# Patient Record
Sex: Female | Born: 1985 | Race: White | Hispanic: No | Marital: Married | Smoking: Current every day smoker
Health system: Southern US, Community
[De-identification: ages and names within clinical notes are randomized; demographics above are authoritative.]

## PROBLEM LIST (undated history)

## (undated) HISTORY — PX: WISDOM TOOTH EXTRACTION: SHX21

---
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
## 2019-06-16 ENCOUNTER — Other Ambulatory Visit: Payer: Self-pay

## 2019-06-16 ENCOUNTER — Emergency Department (HOSPITAL_COMMUNITY)
Admission: EM | Admit: 2019-06-16 | Discharge: 2019-06-16 | Disposition: A | Discharge status: Home / Self Care | Payer: 59 | Attending: Emergency Medicine | Admitting: Emergency Medicine

## 2019-06-16 ENCOUNTER — Emergency Department (HOSPITAL_COMMUNITY): Payer: 59

## 2019-06-16 ENCOUNTER — Encounter (HOSPITAL_COMMUNITY): Payer: Self-pay | Admitting: Emergency Medicine

## 2019-06-16 DIAGNOSIS — R1031 Right lower quadrant pain: Secondary | ICD-10-CM | POA: Diagnosis not present

## 2019-06-16 DIAGNOSIS — R300 Dysuria: Secondary | ICD-10-CM | POA: Insufficient documentation

## 2019-06-16 DIAGNOSIS — R110 Nausea: Secondary | ICD-10-CM | POA: Diagnosis not present

## 2019-06-16 DIAGNOSIS — R935 Abnormal findings on diagnostic imaging of other abdominal regions, including retroperitoneum: Secondary | ICD-10-CM | POA: Insufficient documentation

## 2019-06-16 DIAGNOSIS — I700 Atherosclerosis of aorta: Secondary | ICD-10-CM | POA: Insufficient documentation

## 2019-06-16 DIAGNOSIS — F17210 Nicotine dependence, cigarettes, uncomplicated: Secondary | ICD-10-CM | POA: Diagnosis not present

## 2019-06-16 LAB — CBC WITH DIFFERENTIAL/PLATELET
Abs Immature Granulocytes: 0.03 10*3/uL (ref 0.00–0.07)
Basophils Absolute: 0 10*3/uL (ref 0.0–0.1)
Basophils Relative: 0 %
Eosinophils Absolute: 0.1 10*3/uL (ref 0.0–0.5)
Eosinophils Relative: 1 %
HCT: 42.6 % (ref 36.0–46.0)
Hemoglobin: 14 g/dL (ref 12.0–15.0)
Immature Granulocytes: 0 %
Lymphocytes Relative: 26 %
Lymphs Abs: 2.3 10*3/uL (ref 0.7–4.0)
MCH: 30.2 pg (ref 26.0–34.0)
MCHC: 32.9 g/dL (ref 30.0–36.0)
MCV: 92 fL (ref 80.0–100.0)
Monocytes Absolute: 0.5 10*3/uL (ref 0.1–1.0)
Monocytes Relative: 5 %
Neutro Abs: 5.8 10*3/uL (ref 1.7–7.7)
Neutrophils Relative %: 68 %
Platelets: 216 10*3/uL (ref 150–400)
RBC: 4.63 MIL/uL (ref 3.87–5.11)
RDW: 12.8 % (ref 11.5–15.5)
WBC: 8.6 10*3/uL (ref 4.0–10.5)
nRBC: 0 % (ref 0.0–0.2)

## 2019-06-16 LAB — COMPREHENSIVE METABOLIC PANEL
ALT: 23 U/L (ref 0–44)
AST: 16 U/L (ref 15–41)
Albumin: 4.3 g/dL (ref 3.5–5.0)
Alkaline Phosphatase: 58 U/L (ref 38–126)
Anion gap: 8 (ref 5–15)
BUN: 12 mg/dL (ref 6–20)
CO2: 23 mmol/L (ref 22–32)
Calcium: 8.8 mg/dL — ABNORMAL LOW (ref 8.9–10.3)
Chloride: 104 mmol/L (ref 98–111)
Creatinine, Ser: 0.6 mg/dL (ref 0.44–1.00)
GFR calc Af Amer: >60 mL/min (ref >60)
GFR calc non Af Amer: >60 mL/min (ref >60)
Glucose, Bld: 87 mg/dL (ref 70–99)
Potassium: 3.8 mmol/L (ref 3.5–5.1)
Sodium: 135 mmol/L (ref 135–145)
Total Bilirubin: 0.5 mg/dL (ref 0.3–1.2)
Total Protein: 6.7 g/dL (ref 6.5–8.1)

## 2019-06-16 LAB — LIPASE, BLOOD: Lipase: 27 U/L (ref 11–51)

## 2019-06-16 LAB — URINALYSIS, ROUTINE W REFLEX MICROSCOPIC
Bilirubin Urine: NEGATIVE
Glucose, UA: NEGATIVE mg/dL
Hgb urine dipstick: NEGATIVE
Ketones, ur: NEGATIVE mg/dL
Leukocytes,Ua: NEGATIVE
Nitrite: NEGATIVE
Protein, ur: NEGATIVE mg/dL
Specific Gravity, Urine: 1.016 (ref 1.005–1.030)
pH: 6 (ref 5.0–8.0)

## 2019-06-16 MED ORDER — IOHEXOL 300 MG/ML  SOLN
100.0000 mL | Freq: Once | INTRAMUSCULAR | Status: AC | PRN
  Administered 2019-06-16: 21:00:00 100 mL via INTRAVENOUS

## 2019-06-16 NOTE — Discharge Instructions (Addendum)
You were seen in the ED today for abdominal pain on the right side.  Your lab work was reassuring today.  We did obtain a CT scan to check for appendicitis which was negative.  It did however show some thickening in your small bowel suggestive of inflammatory bowel disease.  Please follow-up with Dr. Laural Golden tomorrow regarding your symptoms.  I would advise taking Tylenol as needed for your pain at the moment.  Is recommended that you increase the fiber in your diet to help with bowel movements.  It is important to continue having bowel movements because if you become constipated you may encounter an infection.  Please see attached resource on high-fiber diet.  Return to the ED immediately for any worsening pain, vomiting, fevers greater than 100.4, blood in your stool, inability to have a bowel movement.

## 2019-06-16 NOTE — ED Triage Notes (Signed)
Pt states that she has been having left sided abd pain since Saturday night. She states that she has bee bloated, diarrhea, pain with urination, and nausea with this pain.

## 2019-06-16 NOTE — ED Provider Notes (Signed)
[facility] Provider Note   CSN: [id number] Arrival date & time: 06/16/19  1758    History   Chief Complaint Chief Complaint  Patient presents with   Abdominal Pain    HPI Barbara Garrett is a 33 y.o. female who presents to the ED today complaining of gradual onset, constant, achy, right lower quadrant abdominal pain for 2 days.  She also complains about nausea and dysuria.  No history of kidney stones, UTI, kidney infection in the past.  Patient has not been taking anything for her pain.  She reports that the pain became so bad today that she came to the ED for further evaluation.  No recent suspicious food intake.  Patient is occasional alcohol drinker.  No recent antibiotics.  Denies fever, chills, vomiting, flank pain, vaginal discharge, vaginal bleeding, back pain.  Patient still has her appendix.  Last normal menstrual period about 2 weeks ago.  Past surgical history includes tubal ligation.      No past medical history on file.  There are no active problems to display for this patient.      OB History   No obstetric history on file.      Home Medications    Prior to Admission medications   Not on File    Family History No family history on file.  Social History Social History   Tobacco Use   Smoking status: Current Every Day Smoker    Packs/day: 1.00    Types: Cigarettes   Smokeless tobacco: Never Used  Substance Use Topics   Alcohol use: Yes    Comment: occ.    Drug use: Never     Allergies   Hydrocodone and Adhesive [tape]   Review of Systems Review of Systems  Constitutional: Negative for chills and fever.  HENT: Negative for congestion.   Eyes: Negative for visual disturbance.  Respiratory: Negative for shortness of breath.   Cardiovascular: Negative for chest pain.  Gastrointestinal: Positive for abdominal pain and nausea. Negative for constipation, diarrhea and vomiting.  Genitourinary: Positive for dysuria.  Negative for flank pain, frequency, pelvic pain, vaginal bleeding and vaginal discharge.  Musculoskeletal: Negative for myalgias.  Skin: Negative for rash.  Neurological: Negative for headaches.     Physical Exam Updated Vital Signs BP 130/88 (BP Location: Right Arm)    Pulse 78    Temp 98.1 F (36.7 C) (Oral)    Resp 14    Ht 5\' 4"  (1.626 m)    Wt 76.2 kg    LMP 05/26/2019    SpO2 100%    BMI 28.84 kg/m   Physical Exam Vitals signs and nursing note reviewed.  Constitutional:      Appearance: She is not ill-appearing.  HENT:     Head: Normocephalic and atraumatic.  Eyes:     Conjunctiva/sclera: Conjunctivae normal.  Neck:     Musculoskeletal: Neck supple.  Cardiovascular:     Rate and Rhythm: Normal rate and regular rhythm.     Heart sounds: Normal heart sounds.  Pulmonary:     Effort: Pulmonary effort is normal.     Breath sounds: Normal breath sounds. No wheezing, rhonchi or rales.  Abdominal:     General: Abdomen is flat.     Palpations: Abdomen is soft.     Tenderness: There is abdominal tenderness in the right lower quadrant. There is no right CVA tenderness or left CVA tenderness. Positive signs include Rovsing's sign and McBurney's sign. Negative signs include psoas sign  and obturator sign.  Skin:    General: Skin is warm and dry.  Neurological:     Mental Status: She is alert.      ED Treatments / Results  Labs (all labs ordered are listed, but only abnormal results are displayed) Labs Reviewed  COMPREHENSIVE METABOLIC PANEL - Abnormal; Notable for the following components:      Result Value   Calcium 8.8 (*)    All other components within normal limits  URINALYSIS, ROUTINE W REFLEX MICROSCOPIC - Abnormal; Notable for the following components:   APPearance HAZY (*)    All other components within normal limits  LIPASE, BLOOD  CBC WITH DIFFERENTIAL/PLATELET    EKG None  Radiology Ct Abdomen Pelvis W Contrast  Result Date: 06/16/2019 CLINICAL DATA:   Abdominal pain, appendicitis suspected. Pain worse at right lower quadrant EXAM: CT ABDOMEN AND PELVIS WITH CONTRAST TECHNIQUE: Multidetector CT imaging of the abdomen and pelvis was performed using the standard protocol following bolus administration of intravenous contrast. CONTRAST:  100mL OMNIPAQUE IOHEXOL 300 MG/ML  SOLN COMPARISON:  None. FINDINGS: Lower chest: Posterior and dependent ground-glass opacity likely reflecting basilar atelectasis. Included portions of the mediastinum are unremarkable. Hepatobiliary: No focal liver abnormality is seen. Layering hyperattenuating biliary sand versus calcified gallstones. No gallbladder wall thickening or biliary ductal dilatation. No calcified intraductal gallstones. Pancreas: Insert pain Spleen: Pancreas Normal in size without focal abnormality. Adrenals/Urinary Tract: Adrenal glands are unremarkable. Kidneys are normal, without renal calculi, focal lesion, or hydronephrosis. Bladder is unremarkable. Stomach/Bowel: Distal esophagus, stomach and duodenal sweep are unremarkable. There is circumferential mural thickening of the terminal ileum near the level of the ileocecal valve (5/39). Mild mural thickening of the cecum is present as well. Faint adjacent hazy mesenteric stranding. Normal appendix is seen in the right lower quadrant coursing towards midline. Vascular/Lymphatic: Calcified and noncalcified atheromatous plaque within the normal caliber aorta. No proximal occlusions. No suspicious or enlarged lymph nodes in the included lymphatic chains. Reproductive: Anteverted uterus. Collapsing corpus luteum noted in the right ovary. No concerning adnexal lesions. Other: No abdominopelvic free fluid or free gas. No bowel containing hernias. Musculoskeletal: No acute osseous abnormality or suspicious osseous lesion. IMPRESSION: 1. Circumferential mural thickening of the terminal ileum near the level of the ileocecal valve with mild mural thickening of the cecum.  Findings are concerning for inflammatory bowel disease such as Crohn's disease. Infectious enterocolitis is also possible though felt less likely. No evidence of perforation or abscess formation. 2. Normal appendix. 3. Layering hyperattenuating biliary sand versus calcified gallstones. No evidence of acute cholecystitis. 4. Aortic Atherosclerosis (ICD10-I70.0). Electronically Signed   By: Kreg ShropshirePrice  DeHay M.D.   On: 06/16/2019 20:56    Procedures Procedures (including critical care time)  Medications Ordered in ED Medications  iohexol (OMNIPAQUE) 300 MG/ML solution 100 mL (100 mLs Intravenous Contrast Given 06/16/19 2037)     Initial Impression / Assessment and Plan / ED Course  I have reviewed the triage vital signs and the nursing notes.  Pertinent labs & imaging results that were available during my care of the patient were reviewed by me and considered in my medical decision making (see chart for details).    Patient is an otherwise healthy 33 year old female who presents to the ED today complaining of right lower quadrant abdominal pain for the past couple of days.  He also endorses nausea and dysuria.  No melena or hematochezia.  Patient is still having bowel movements.  Denies fever or chills.  She is  afebrile in the ED without tachycardia or tachypnea.  Past surgical history includes tubal ligation.  Last normal menstrual period about 2 weeks ago.  She does have right lower quadrant tenderness on exam with positive Rovsing sign and mcBurney sign.  Concern for appendicitis at this time.  Will obtain baseline blood work as well as CT abdomen and pelvis.  Will check UA given complaint of dysuria.  Patient has no flank tenderness on exam.  Low suspicion for Pyelo versus kidney stones today.  She states that her pain is okay currently and does not require anything for pain or nausea at this point in time.  We will continue to reevaluate.   UA without any blood or bacteria to suggest kidney stone  versus infection.  CBC without leukocytosis.  No electrolyte derangements.  Liver function tests within normal limits.  Creatinine normal.  CT scan does show some thickening in the ileum concerning for inflammatory bowel disease.  Patient's appendix appears normal on the CT scan.  Discussed case with attending physician Dr. Eulis Foster who suggests patient follow-up with GI in the area.  Without any kind of infectious type symptoms including blood in the stool, fever, chills do not believe that patient needs to be on antibiotics currently.  Discussed case with patient and she is in agreement at this time.  She will take Tylenol as needed for her pain and follow-up with GI in the morning.      Final Clinical Impressions(s) / ED Diagnoses   Final diagnoses:  RLQ abdominal pain  Nausea    ED Discharge Orders    None       Eustaquio Maize, Hershal Coria 06/16/19 2128    Daleen Bo, MD 06/17/19 1227

## 2020-09-03 ENCOUNTER — Other Ambulatory Visit: Payer: Self-pay

## 2020-09-03 ENCOUNTER — Ambulatory Visit
Admission: EM | Admit: 2020-09-03 | Discharge: 2020-09-03 | Disposition: A | Discharge status: Home / Self Care | Payer: 59 | Attending: Emergency Medicine | Admitting: Emergency Medicine

## 2020-09-03 DIAGNOSIS — J029 Acute pharyngitis, unspecified: Secondary | ICD-10-CM | POA: Diagnosis not present

## 2020-09-03 DIAGNOSIS — J069 Acute upper respiratory infection, unspecified: Secondary | ICD-10-CM

## 2020-09-03 DIAGNOSIS — Z1152 Encounter for screening for COVID-19: Secondary | ICD-10-CM

## 2020-09-03 MED ORDER — PREDNISONE 10 MG PO TABS: 20.0000 mg | ORAL_TABLET | Freq: Every day | ORAL | 0 refills | Status: AC

## 2020-09-03 MED ORDER — CETIRIZINE HCL 10 MG PO TABS: 10.0000 mg | ORAL_TABLET | Freq: Every day | ORAL | 0 refills | Status: AC

## 2020-09-03 MED ORDER — FLUTICASONE PROPIONATE 50 MCG/ACT NA SUSP: 1.0000 | Freq: Every day | NASAL | 0 refills | Status: AC

## 2020-09-03 MED ORDER — BENZONATATE 100 MG PO CAPS: 100.0000 mg | ORAL_CAPSULE | Freq: Three times a day (TID) | ORAL | 0 refills | Status: AC

## 2020-09-03 NOTE — ED Provider Notes (Addendum)
[facility]   [id number] 09/03/20 Arrival Time: 0917  CC: COVID symptoms   SUBJECTIVE: History from: patient and family.  Barbara Garrett is a 34 y.o. female who presents to the urgent care with a complaint of nasal congestion, cough and scratchy throat that started 2 days ago. Denies sick exposure or precipitating event.  Has tried OTC medication without relief. Denies aggravating factors. Denies previous symptoms in the past.    Denies fever, chills, decreased appetite, decreased activity, drooling, vomiting, wheezing, rash, changes in bowel or bladder function.    ROS: As per HPI.  All other pertinent ROS negative.     History reviewed. No pertinent past medical history. Past Surgical History:  Procedure Laterality Date  . CESAREAN SECTION    . WISDOM TOOTH EXTRACTION     Allergies  Allergen Reactions  . Hydrocodone Rash  . Adhesive [Tape] Itching and Rash   No current facility-administered medications on file prior to encounter.   No current outpatient medications on file prior to encounter.   Social History   Socioeconomic History  . Marital status: Married    Spouse name: Not on file  . Number of children: Not on file  . Years of education: Not on file  . Highest education level: Not on file  Occupational History  . Not on file  Tobacco Use  . Smoking status: Current Every Day Smoker    Packs/day: 1.00    Types: Cigarettes  . Smokeless tobacco: Never Used  Substance and Sexual Activity  . Alcohol use: Yes    Comment: occ.   . Drug use: Never  . Sexual activity: Not on file  Other Topics Concern  . Not on file  Social History Narrative  . Not on file   Social Determinants of Health   Financial Resource Strain:   . Difficulty of Paying Living Expenses: Not on file  Food Insecurity:   . Worried About Programme researcher, broadcasting/film/video in the Last Year: Not on file  . Ran Out of Food in the Last Year: Not on file  Transportation Needs:   . Lack of  Transportation (Medical): Not on file  . Lack of Transportation (Non-Medical): Not on file  Physical Activity:   . Days of Exercise per Week: Not on file  . Minutes of Exercise per Session: Not on file  Stress:   . Feeling of Stress : Not on file  Social Connections:   . Frequency of Communication with Friends and Family: Not on file  . Frequency of Social Gatherings with Friends and Family: Not on file  . Attends Religious Services: Not on file  . Active Member of Clubs or Organizations: Not on file  . Attends Banker Meetings: Not on file  . Marital Status: Not on file  Intimate Partner Violence:   . Fear of Current or Ex-Partner: Not on file  . Emotionally Abused: Not on file  . Physically Abused: Not on file  . Sexually Abused: Not on file   Family History  Family history unknown: Yes    OBJECTIVE:  Vitals:   09/03/20 0932  BP: 112/73  Pulse: 83  Resp: 18  Temp: 98.2 F (36.8 C)  SpO2: 96%     General appearance: alert; smiling and laughing during encounter; nontoxic appearance HEENT: NCAT; Ears: EACs clear, TMs pearly gray; Eyes: PERRL.  EOM grossly intact. Nose: no rhinorrhea without nasal flaring; Throat: oropharynx clear, tolerating own secretions, tonsils not erythematous or enlarged, uvula midline  Neck: supple without LAD; FROM Lungs: CTA bilaterally without adventitious breath sounds; normal respiratory effort, no belly breathing or accessory muscle use; no cough present Heart: regular rate and rhythm.  Radial pulses 2+ symmetrical bilaterally Abdomen: soft; normal active bowel sounds; nontender to palpation Skin: warm and dry; no obvious rashes Psychological: alert and cooperative; normal mood and affect appropriate for age   ASSESSMENT & PLAN:  1. Encounter for screening for COVID-19   2. URI with cough and congestion   3. Sore throat     Meds ordered this encounter  Medications  . benzonatate (TESSALON) 100 MG capsule    Sig: Take 1  capsule (100 mg total) by mouth every 8 (eight) hours.    Dispense:  30 capsule    Refill:  0  . fluticasone (FLONASE) 50 MCG/ACT nasal spray    Sig: Place 1 spray into both nostrils daily for 14 days.    Dispense:  16 g    Refill:  0  . cetirizine (ZYRTEC ALLERGY) 10 MG tablet    Sig: Take 1 tablet (10 mg total) by mouth daily.    Dispense:  30 tablet    Refill:  0  . predniSONE (DELTASONE) 10 MG tablet    Sig: Take 2 tablets (20 mg total) by mouth daily.    Dispense:  15 tablet    Refill:  0     Discharge instructions  COVID testing ordered.  It may take between 2 - 7 days for test results  In the meantime: You should remain isolated in your home for 10 days from symptom onset AND greater than image 24 with healthcare and urine hours after symptoms resolution (absence of fever without the use of fever-reducing medication and improvement in respiratory symptoms), whichever is longer Encourage fluid intake.  You may supplement with OTC pedialyte Zyrtec prescribed for congestion  Flonase prescribed for congestion Tessalon Perles prescribed for cough Prednisone was prescribed Continue to alternate Children's tylenol/ motrin as needed for pain and fever Follow up with pediatrician next week for recheck Call or go to the ED if child has any new or worsening symptoms like fever, decreased appetite, decreased activity, turning blue, nasal flaring, rib retractions, wheezing, rash, changes in bowel or bladder habits, etc...   Reviewed expectations re: course of current medical issues. Questions answered. Outlined signs and symptoms indicating need for more acute intervention. Patient verbalized understanding. After Visit Summary given.          Durward Parcel, FNP 09/03/20 0959    Durward Parcel, FNP 09/03/20 1000

## 2020-09-03 NOTE — Discharge Instructions (Addendum)
COVID testing ordered.  It may take between 2 - 7 days for test results  In the meantime: You should remain isolated in your home for 10 days from symptom onset AND greater than image 24 with healthcare and urine hours after symptoms resolution (absence of fever without the use of fever-reducing medication and improvement in respiratory symptoms), whichever is longer Encourage fluid intake.  You may supplement with OTC pedialyte Zyrtec prescribed for congestion  Flonase prescribed for congestion Tessalon Perles prescribed for cough Prednisone was prescribed Continue to alternate Children's tylenol/ motrin as needed for pain and fever Follow up with pediatrician next week for recheck Call or go to the ED if child has any new or worsening symptoms like fever, decreased appetite, decreased activity, turning blue, nasal flaring, rib retractions, wheezing, rash, changes in bowel or bladder habits, etc..Marland Kitchen

## 2020-09-03 NOTE — ED Triage Notes (Signed)
Pt presents with nasal congestion and scratchy throat since wednesday

## 2020-09-04 LAB — NOVEL CORONAVIRUS, NAA: SARS-CoV-2, NAA: NOT DETECTED

## 2020-09-04 LAB — SARS-COV-2, NAA 2 DAY TAT

## 2020-09-13 IMAGING — CT CT ABDOMEN AND PELVIS WITH CONTRAST
2 of 4 series · 15 of 46 positions shown, 17 images · IV contrast (omnipaque)
Comparison: None.

CLINICAL DATA: Abdominal pain, appendicitis suspected. Pain worse
at right lower quadrant

EXAM:
CT ABDOMEN AND PELVIS WITH CONTRAST
TECHNIQUE: Multidetector CT imaging of the abdomen and pelvis was performed
using the standard protocol following bolus administration of
intravenous contrast.
CONTRAST:  100mL OMNIPAQUE IOHEXOL 300 MG/ML  SOLN

[Series 2: axial st · axial · 0.69mm/px · z∈[-525,-65]mm · 12 of 102 slices shown, 14 images]
[im 5/102  soft-tissue]
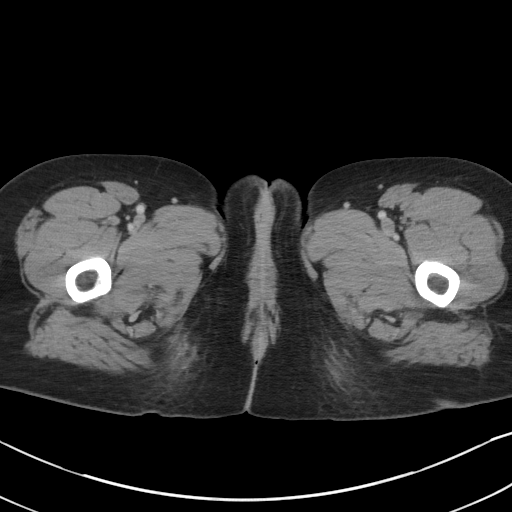
[im 5/102  bone]
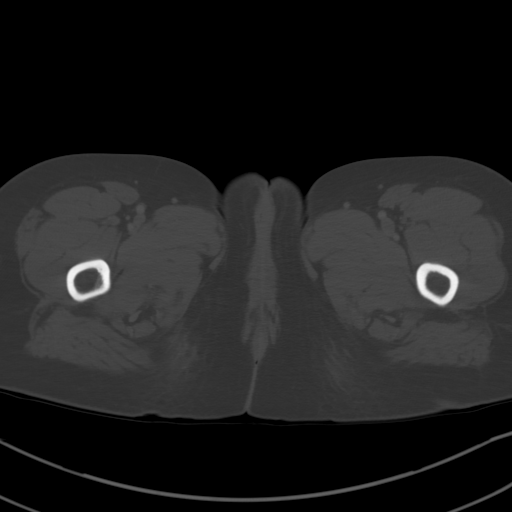
[im 15/102  soft-tissue]
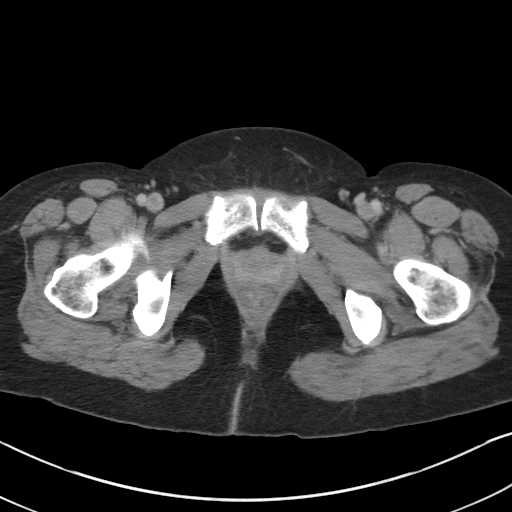
[im 25/102  soft-tissue]
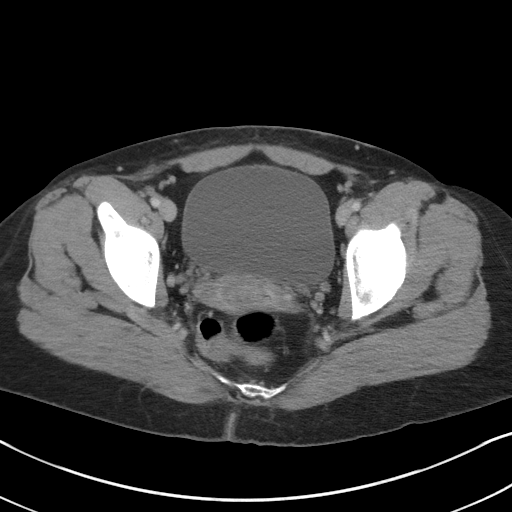
[im 29/102  soft-tissue]
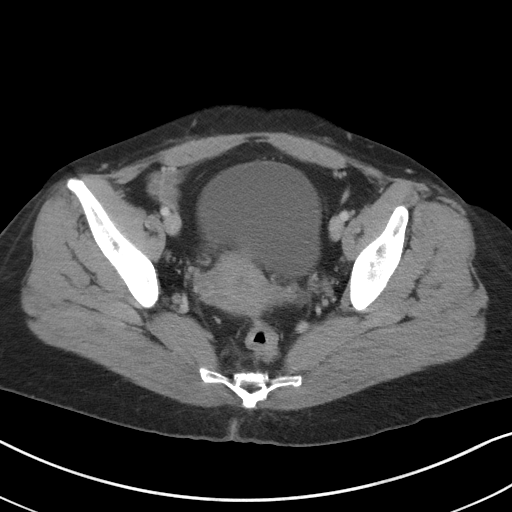
[im 39/102  soft-tissue]
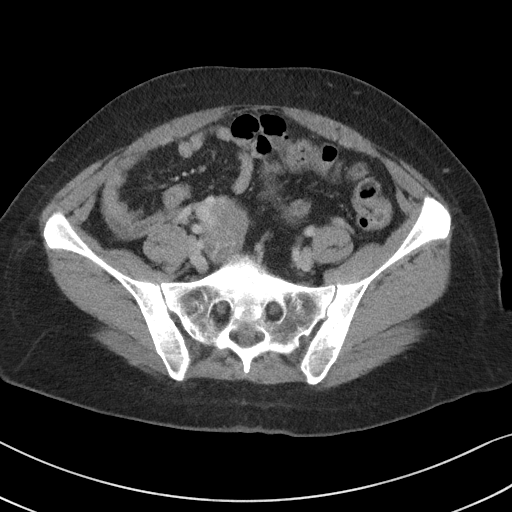
[im 49/102  soft-tissue]
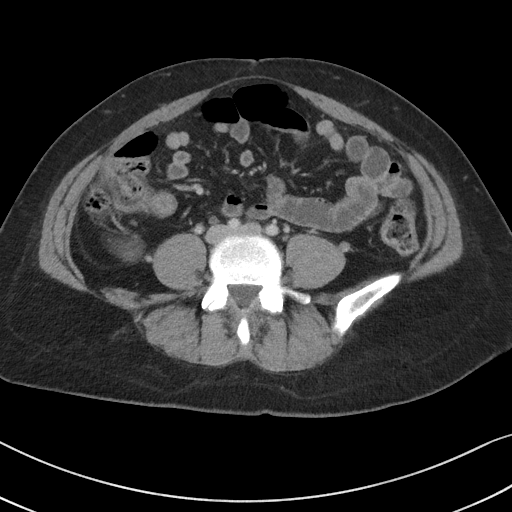
[im 53/102  soft-tissue]
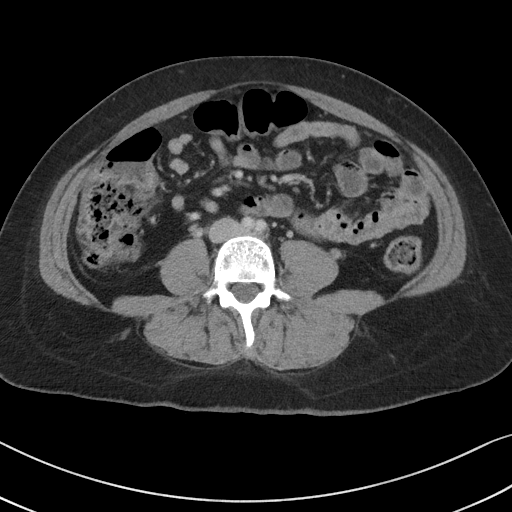
[im 63/102  soft-tissue]
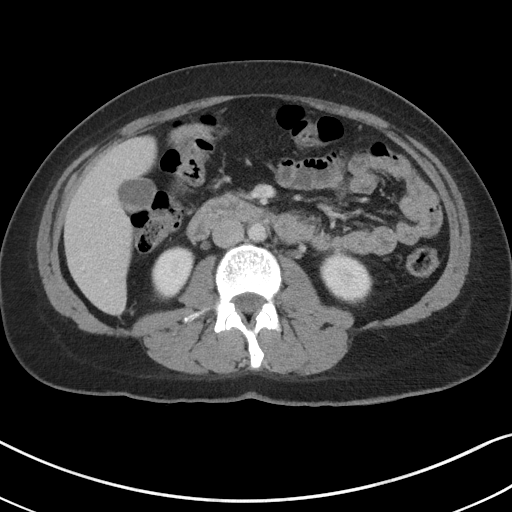
[im 73/102  soft-tissue]
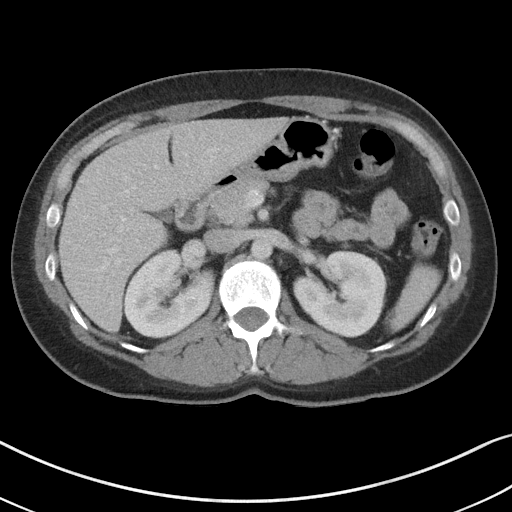
[im 73/102  bone]
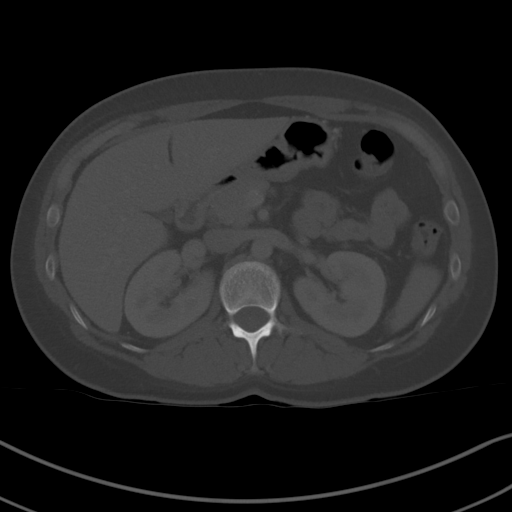
[im 77/102  soft-tissue]
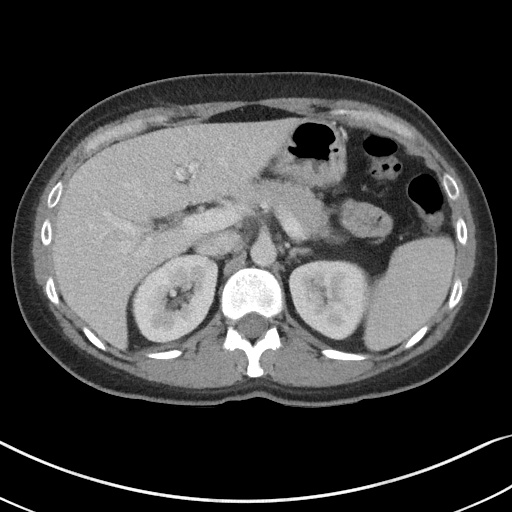
[im 87/102  soft-tissue]
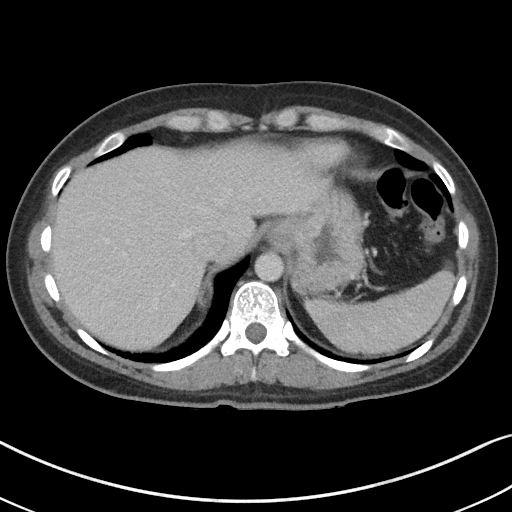
[im 97/102  soft-tissue]
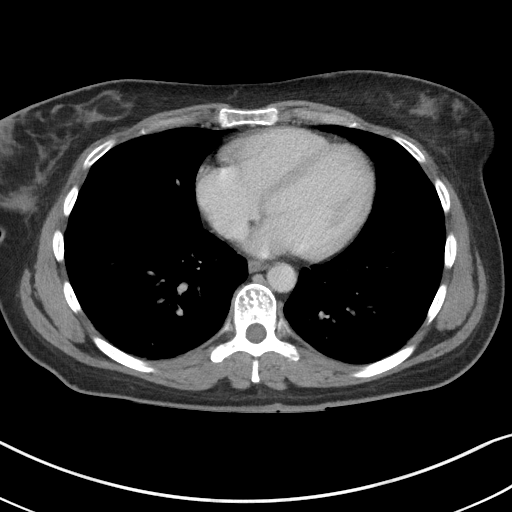

[Series 5: coronal st · coronal · 0.67mm/px · 3 of 90 slices shown]
[im 30/90  soft-tissue]
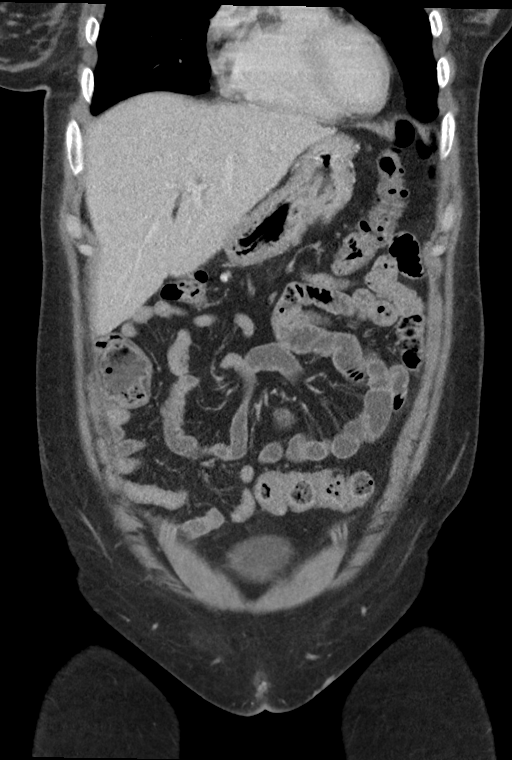
[im 40/90  soft-tissue]
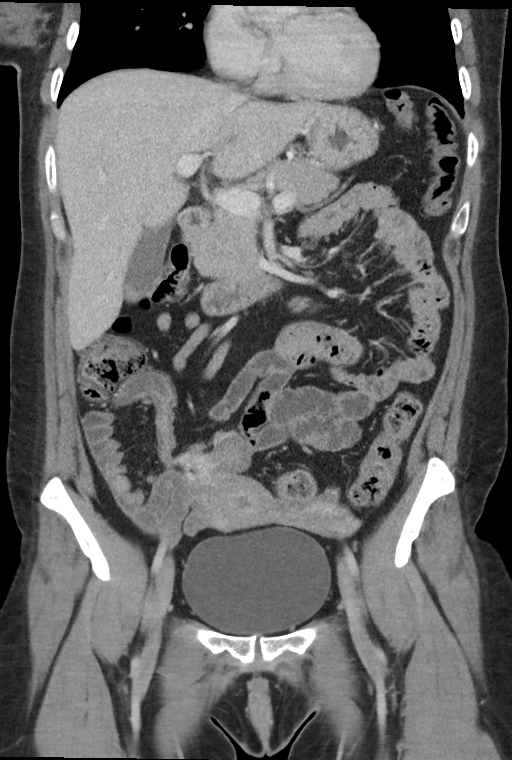
[im 50/90  soft-tissue]
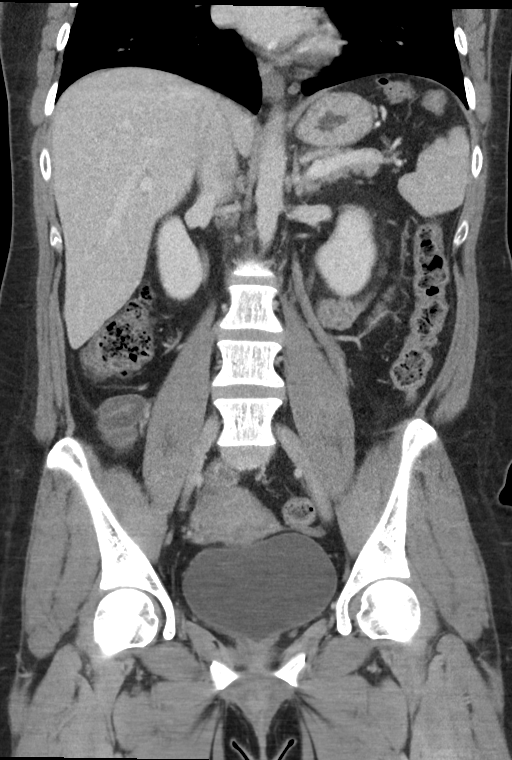

[15 of 46 positions shown; findings below may reference images not displayed]

FINDINGS: Lower chest: Posterior and dependent ground-glass opacity likely
reflecting basilar atelectasis. Included portions of the mediastinum
are unremarkable.

Hepatobiliary: No focal liver abnormality is seen. Layering
hyperattenuating biliary Hortensia versus calcified gallstones. No
gallbladder wall thickening or biliary ductal dilatation. No
calcified intraductal gallstones.

Pancreas: Insert pain

Spleen: Pancreas Normal in size without focal abnormality.

Adrenals/Urinary Tract: Adrenal glands are unremarkable. Kidneys are
normal, without renal calculi, focal lesion, or hydronephrosis.
Bladder is unremarkable.

Stomach/Bowel: Distal esophagus, stomach and duodenal sweep are
unremarkable. There is circumferential mural thickening of the
terminal ileum near the level of the ileocecal valve (5/39). Mild
mural thickening of the cecum is present as well. Faint adjacent
hazy mesenteric stranding. Normal appendix is seen in the right
lower quadrant coursing towards midline.

Vascular/Lymphatic: Calcified and noncalcified atheromatous plaque
within the normal caliber aorta. No proximal occlusions. No
suspicious or enlarged lymph nodes in the included lymphatic chains.

Reproductive: Anteverted uterus. Collapsing corpus luteum noted in
the right ovary. No concerning adnexal lesions.

Other: No abdominopelvic free fluid or free gas. No bowel containing
hernias.

Musculoskeletal: No acute osseous abnormality or suspicious osseous
lesion.
IMPRESSION: 1. Circumferential mural thickening of the terminal ileum near the
level of the ileocecal valve with mild mural thickening of the
cecum. Findings are concerning for inflammatory bowel disease such
as Crohn's disease. Infectious enterocolitis is also possible though
felt less likely. No evidence of perforation or abscess formation.
2. Normal appendix.
3. Layering hyperattenuating biliary Hortensia versus calcified
gallstones. No evidence of acute cholecystitis.
4. Aortic Atherosclerosis ([id number]-SXV.V).
